# Patient Record
Sex: Male | Born: 1980 | Race: White | Hispanic: No | Marital: Single | Smoking: Current every day smoker
Health system: Southern US, Community
[De-identification: ages and names within clinical notes are randomized; demographics above are authoritative.]

## PROBLEM LIST (undated history)

## (undated) DIAGNOSIS — Z8709 Personal history of other diseases of the respiratory system: Secondary | ICD-10-CM

## (undated) DIAGNOSIS — F411 Generalized anxiety disorder: Secondary | ICD-10-CM

## (undated) DIAGNOSIS — F329 Major depressive disorder, single episode, unspecified: Secondary | ICD-10-CM

## (undated) DIAGNOSIS — F17200 Nicotine dependence, unspecified, uncomplicated: Secondary | ICD-10-CM

## (undated) DIAGNOSIS — R569 Unspecified convulsions: Secondary | ICD-10-CM

## (undated) HISTORY — DX: Unspecified convulsions: R56.9

## (undated) HISTORY — PX: KNEE SURGERY: SHX244

## (undated) HISTORY — DX: Major depressive disorder, single episode, unspecified: F32.9

## (undated) HISTORY — DX: Personal history of other diseases of the respiratory system: Z87.09

## (undated) HISTORY — DX: Nicotine dependence, unspecified, uncomplicated: F17.200

## (undated) HISTORY — DX: Generalized anxiety disorder: F41.1

---

## 2004-07-07 ENCOUNTER — Ambulatory Visit: Payer: Self-pay | Admitting: Family Medicine

## 2006-09-06 ENCOUNTER — Ambulatory Visit: Payer: Self-pay | Admitting: Family Medicine

## 2006-12-29 DIAGNOSIS — Z8709 Personal history of other diseases of the respiratory system: Secondary | ICD-10-CM | POA: Insufficient documentation

## 2006-12-29 DIAGNOSIS — R569 Unspecified convulsions: Secondary | ICD-10-CM

## 2006-12-29 DIAGNOSIS — F329 Major depressive disorder, single episode, unspecified: Secondary | ICD-10-CM | POA: Insufficient documentation

## 2006-12-29 DIAGNOSIS — F3289 Other specified depressive episodes: Secondary | ICD-10-CM

## 2006-12-29 HISTORY — DX: Major depressive disorder, single episode, unspecified: F32.9

## 2006-12-29 HISTORY — DX: Unspecified convulsions: R56.9

## 2006-12-29 HISTORY — DX: Other specified depressive episodes: F32.89

## 2006-12-29 HISTORY — DX: Personal history of other diseases of the respiratory system: Z87.09

## 2007-03-23 ENCOUNTER — Telehealth: Payer: Self-pay | Admitting: Family Medicine

## 2008-03-25 ENCOUNTER — Ambulatory Visit: Payer: Self-pay | Admitting: Family Medicine

## 2008-03-25 DIAGNOSIS — F411 Generalized anxiety disorder: Secondary | ICD-10-CM

## 2008-03-25 HISTORY — DX: Generalized anxiety disorder: F41.1

## 2009-04-10 ENCOUNTER — Ambulatory Visit: Payer: Self-pay | Admitting: Family Medicine

## 2009-04-10 DIAGNOSIS — F17200 Nicotine dependence, unspecified, uncomplicated: Secondary | ICD-10-CM

## 2009-04-10 HISTORY — DX: Nicotine dependence, unspecified, uncomplicated: F17.200

## 2009-04-14 ENCOUNTER — Telehealth: Payer: Self-pay | Admitting: Family Medicine

## 2009-10-27 ENCOUNTER — Telehealth: Payer: Self-pay | Admitting: *Deleted

## 2009-12-29 ENCOUNTER — Telehealth: Payer: Self-pay | Admitting: Family Medicine

## 2010-03-04 ENCOUNTER — Telehealth: Payer: Self-pay | Admitting: Family Medicine

## 2010-04-15 ENCOUNTER — Ambulatory Visit: Payer: Self-pay | Admitting: Family Medicine

## 2010-04-15 ENCOUNTER — Telehealth: Payer: Self-pay | Admitting: Family Medicine

## 2010-06-09 NOTE — Progress Notes (Signed)
Summary: refill request  Phone Note Refill Request Message from:  Fax from Pharmacy on October 27, 2009 1:15 PM  Refills Requested: Medication #1:  KLONOPIN 0.5 MG  TABS Take 1 tablet by mouth three times a day Initial call taken by: Kern Reap CMA (AAMA),  October 27, 2009 1:15 PM    Prescriptions: KLONOPIN 0.5 MG  TABS (CLONAZEPAM) Take 1 tablet by mouth three times a day  #90 x 1   Entered by:   Kern Reap CMA (AAMA)   Authorized by:   Roderick Pee MD   Signed by:   Kern Reap CMA (AAMA) on 10/27/2009   Method used:   Telephoned to ...       [facility]  [address] [phone]* (retail)       [address]       [place], [place]  [address]       Ph: [phone]       Fax: [phone]   RxID:   [id number]

## 2010-06-09 NOTE — Progress Notes (Signed)
Summary: Pt had to rsc. Pt req refill of Clonazepam to [facility] [address] [facility]  Phone Note Refill Request Call back at [facility] Phone [phone]   Refills Requested: Medication #1:  KLONOPIN 0.5 MG  TABS Take 1 tablet by mouth three times a day   Dosage confirmed as above?Dosage Confirmed Pt had to cx his ov for today, because he had to work. Pt has rsc for tues 04/21/10, but is req a refill of his Clonazepam to be called in to [facility] on [address] in [place].    Method Requested: Telephone to [facility] on [address] in [facility]  Initial call taken by: Lucy Antigua,  April 15, 2010 9:09 AM    Prescriptions: KLONOPIN 0.5 MG  TABS (CLONAZEPAM) Take 1 tablet by mouth three times a day  #90 x 0   Entered by:   Kern Reap CMA (AAMA)   Authorized by:   Roderick Pee MD   Signed by:   Kern Reap CMA (AAMA) on 04/15/2010   Method used:   Telephoned to ...       [facility]  [address] [phone]* (retail)       [address]       [place], [place]  [address]       Ph: [phone]       Fax: [phone]   RxID:   [id number]

## 2010-06-09 NOTE — Progress Notes (Signed)
Summary: refill  Phone Note Refill Request Message from:  Fax from Pharmacy on March 04, 2010 1:59 PM  Refills Requested: Medication #1:  KLONOPIN 0.5 MG  TABS Take 1 tablet by mouth three times a day Initial call taken by: Kern Reap CMA Duncan Dull),  March 04, 2010 1:59 PM    Prescriptions: KLONOPIN 0.5 MG  TABS (CLONAZEPAM) Take 1 tablet by mouth three times a day  #90 x 0   Entered by:   Kern Reap CMA (AAMA)   Authorized by:   Roderick Pee MD   Signed by:   Kern Reap CMA (AAMA) on 03/04/2010   Method used:   Historical   RxID:   [id number]

## 2010-06-09 NOTE — Progress Notes (Signed)
Summary: klonopin refill  Phone Note Refill Request Message from:  Fax from Pharmacy on December 29, 2009 12:18 PM  Refills Requested: Medication #1:  KLONOPIN 0.5 MG  TABS Take 1 tablet by mouth three times a day Initial call taken by: Kern Reap CMA Duncan Dull),  December 29, 2009 12:18 PM    Prescriptions: KLONOPIN 0.5 MG  TABS (CLONAZEPAM) Take 1 tablet by mouth three times a day  #90 x 1   Entered by:   Kern Reap CMA (AAMA)   Authorized by:   Roderick Pee MD   Signed by:   Kern Reap CMA (AAMA) on 12/29/2009   Method used:   Historical   RxID:   [id number]

## 2010-06-11 NOTE — Assessment & Plan Note (Signed)
Summary: MED CHECK/CJR/PT RSC/CJR   Vital Signs:  Patient profile:   30 year old male Height:      66 inches Weight:      211 pounds BMI:     34.18 Temp:     97.8 degrees F oral BP sitting:   120 / 80  (left arm) Cuff size:   regular  Vitals Entered By: Kern Reap CMA Duncan Dull) (April 21, 2010 10:22 AM) CC: follow-up visit   CC:  follow-up visit.  History of Present Illness: Bryan Nguyen is a 30 year old male ex smoker x 1 year.  He comes in today for follow-up of his medications.  He is currently off the chantix completely.  He is on Zoloft 100 mg daily and Klonopin .5 t.i.d. and his mood is good.  He is functioning well and doing well.  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 many years ago.  His concern is about weight gain.  He is at  211 pounds.  He would like to discuss an alternative medication  Allergies: No Known Drug Allergies  Past History:  Past medical, surgical, family and social histories (including risk factors) reviewed for relevance to current acute and chronic problems.  Past Medical History: Reviewed history from 03/25/2008 and no changes required. Depression Pneumonia, hx of Seizure disorder Anxiety  Family History: Reviewed history and no changes required.  Social History: Reviewed history from 03/25/2008 and no changes required.  Alcohol use-no Drug use-no Current Smoker Single  Review of Systems      See HPI  Physical Exam  General:  Well-developed,well-nourished,in no acute distress; alert,appropriate and cooperative throughout examination Psych:  Cognition and judgment appear intact. Alert and cooperative with normal attention span and concentration. No apparent delusions, illusions, hallucinations   Impression & Recommendations:  Problem # 1:  TOBACCO ABUSE (ICD-305.1) Assessment Improved  His updated medication  list for this problem includes:    Chantix Continuing Month Pak 1 Mg Tabs (Varenicline tartrate) ..... Uad  Problem # 2:  ANXIETY (ICD-300.00) Assessment: Improved  The following medications were removed from the medication list:    Zoloft 100 Mg Tabs (Sertraline hcl) .Marland Kitchen... Take one tab by mouth once daily His updated medication list for this problem includes:    Klonopin 0.5 Mg Tabs (Clonazepam) .Marland Kitchen... Take 1 tablet by mouth three times a day    Effexor Xr 75 Mg Xr24h-cap (Venlafaxine hcl) .Marland Kitchen... Take 1 tablet by mouth every morning  Problem # 3:  DEPRESSION (ICD-311) Assessment: Improved  The following medications were removed from the medication list:    Zoloft 100 Mg Tabs (Sertraline hcl) .Marland Kitchen... Take one tab by mouth once daily His updated medication list for this problem includes:    Klonopin 0.5 Mg Tabs (Clonazepam) .Marland Kitchen... Take 1 tablet by mouth three times a day    Effexor Xr 75 Mg Xr24h-cap (Venlafaxine hcl) .Marland Kitchen... Take 1 tablet by mouth every morning  Complete Medication List: 1)  Klonopin 0.5 Mg Tabs (Clonazepam) .... Take 1 tablet by mouth three times a day 2)  Chantix Continuing Month Pak 1 Mg Tabs (Varenicline tartrate) .... Uad 3)  Effexor Xr 75 Mg Xr24h-cap (Venlafaxine hcl) .... Take 1 tablet by mouth every morning  Patient Instructions: 1)  stop the Zoloft and start Effexor 75 mg daily.  If after one month, you doing well continue this medication if you have any  questions or problems...........  Call 2)  Continue the Klonopin .53 times a day. 3)  One half of the chantix tablet daily if you feel like smoking again 4)  Please schedule a follow-up appointment in 1 year. 5)  Please schedule a follow-up appointment as needed. Prescriptions: CHANTIX CONTINUING MONTH PAK 1 MG TABS (VARENICLINE TARTRATE) UAD  #1 x 3   Entered and Authorized by:   Roderick Pee MD   Signed by:   Roderick Pee MD on 04/21/2010   Method used:   Print then Give to Patient   RxID:    [id number] KLONOPIN 0.5 MG  TABS (CLONAZEPAM) Take 1 tablet by mouth three times a day  #100 x 11   Entered and Authorized by:   Roderick Pee MD   Signed by:   Roderick Pee MD on 04/21/2010   Method used:   Print then Give to Patient   RxID:   [id number] EFFEXOR XR 75 MG XR24H-CAP (VENLAFAXINE HCL) Take 1 tablet by mouth every morning  #100 x 3   Entered and Authorized by:   Roderick Pee MD   Signed by:   Roderick Pee MD on 04/21/2010   Method used:   Print then Give to Patient   RxID:   [id number]    Orders Added: 1)  Est. Patient Level III [01027]

## 2010-11-10 ENCOUNTER — Other Ambulatory Visit: Payer: Self-pay | Admitting: *Deleted

## 2010-11-10 MED ORDER — CLONAZEPAM 0.5 MG PO TABS: 0.5000 mg | ORAL_TABLET | Freq: Three times a day (TID) | ORAL | Status: DC | PRN

## 2010-11-12 ENCOUNTER — Other Ambulatory Visit: Payer: Self-pay | Admitting: Family Medicine

## 2010-11-12 NOTE — Telephone Encounter (Signed)
Pt called req status of clonazePAM (KLONOPIN) 0.5 MG tablet. Pls be sure this is called in asap to [facility] in [place] [phone]. Pt is completely of of medication.

## 2010-11-12 NOTE — Telephone Encounter (Signed)
Called in.

## 2011-05-28 ENCOUNTER — Encounter: Payer: Self-pay | Admitting: Family Medicine

## 2011-05-31 ENCOUNTER — Ambulatory Visit (INDEPENDENT_AMBULATORY_CARE_PROVIDER_SITE_OTHER): Payer: Self-pay | Admitting: Family Medicine

## 2011-05-31 ENCOUNTER — Encounter: Payer: Self-pay | Admitting: Family Medicine

## 2011-05-31 DIAGNOSIS — F329 Major depressive disorder, single episode, unspecified: Secondary | ICD-10-CM

## 2011-05-31 DIAGNOSIS — Z23 Encounter for immunization: Secondary | ICD-10-CM

## 2011-05-31 DIAGNOSIS — F411 Generalized anxiety disorder: Secondary | ICD-10-CM

## 2011-05-31 MED ORDER — VENLAFAXINE HCL ER 75 MG PO CP24: 75.0000 mg | ORAL_CAPSULE | Freq: Every day | ORAL | Status: DC

## 2011-05-31 MED ORDER — CLONAZEPAM 0.5 MG PO TABS: 0.5000 mg | ORAL_TABLET | Freq: Three times a day (TID) | ORAL | Status: DC | PRN

## 2011-05-31 NOTE — Progress Notes (Signed)
  Subjective:    Patient ID: Bryan Nguyen, male    DOB: [birth year], 31 y.o.   MRN: [id number]  HPI Bryan Nguyen  is a 31 year old single male Who comes in today for yearly evaluation because of a history of underlying anxiety and depression.  He currently takes Klonopin .53 times daily and Effexor, long-acting 75 mg daily.  He states his mood is good.  He is functioning well and wants to keep his medication.  The same.  He is working full time at a [employer], product/process development as Engineer, site also going to school and lying trying to give his history degree.  His 30 hours short of graduation.  He has a steady girlfriend for the last 5 years.  He quit smoking in October 2012 Review of Systems General and psychiatric review of systems otherwise negative    Objective:   Physical Exam Well-developed well-nourished, male in no acute distress.  He is oriented x 3, is appropriate, not depressed, not anxious       Assessment & Plan:  Anxiety/depression, clinically stable on medications.  Continue medications follow-up in one year or sooner if any problems

## 2011-05-31 NOTE — Patient Instructions (Signed)
Continue the Klonopin 3 times daily, and the Effexor once daily.  Return in one year or sooner if any problems

## 2011-09-23 ENCOUNTER — Telehealth: Payer: Self-pay | Admitting: Family Medicine

## 2011-09-23 ENCOUNTER — Other Ambulatory Visit: Payer: Self-pay | Admitting: *Deleted

## 2011-09-23 DIAGNOSIS — F411 Generalized anxiety disorder: Secondary | ICD-10-CM

## 2011-09-23 MED ORDER — CLONAZEPAM 0.5 MG PO TABS: 0.5000 mg | ORAL_TABLET | Freq: Three times a day (TID) | ORAL | Status: DC | PRN

## 2011-09-23 NOTE — Telephone Encounter (Signed)
Fleet Contras please call this medication cannot be refilled

## 2011-09-23 NOTE — Telephone Encounter (Signed)
Patient brought in "washed" bottle of medication.  Okay for a early refill per Dr Tawanna Cooler.  Rx called in.

## 2011-09-23 NOTE — Telephone Encounter (Signed)
Left message on machine for patient

## 2011-09-23 NOTE — Telephone Encounter (Signed)
[facility] Triage Call Report Triage Record Num: [id number] Operator: Reino Bellis Patient Name: Bryan Nguyen Call Date & Time: 09/22/2011 6:43:12PM Patient Phone: [phone] PCP: Eugenio Hoes. Todd Patient Gender: Male PCP Fax : [phone] Patient DOB: December 07, 1980 Practice Name: [facility] Reason for Call: Caller: Amber/Significant Other; PCP: Roderick Pee.; CB#: [phone]; Call regarding Medication Issue; Medication(s): klonopin; Pt. calling to request refill for Klonopin. Pt. reports he washed a whole bottle of medication (Klonopin) and his wallet in the washing machine on 09/22/11. RX was last refilled on 09/20/11. Pt. instructed to call office in AM on 09/23/11 to follow up with MD regarding medication refill. Pt. verbalizes understanding. Protocol(s) Used: Office Note Recommended Outcome per Protocol: Information Noted and Sent to Office Reason for Outcome: Caller information to office Care Advice: ~ 05/

## 2011-09-23 NOTE — Telephone Encounter (Signed)
Patient called stating that he refilled his Klonipin 3 days ago and he accidentally washed it in his pants pocket. Please advise.

## 2011-09-23 NOTE — Telephone Encounter (Signed)
Left message on machine for patient for patient 

## 2011-09-23 NOTE — Telephone Encounter (Signed)
Pt called back to check on status of getting a refill of Klonipin. Pt says that he can bring in the clump of meds, to show that he really did wash them in washing machine. Pt req call back.

## 2011-12-01 ENCOUNTER — Other Ambulatory Visit: Payer: Self-pay | Admitting: *Deleted

## 2011-12-01 DIAGNOSIS — F411 Generalized anxiety disorder: Secondary | ICD-10-CM

## 2011-12-01 MED ORDER — CLONAZEPAM 0.5 MG PO TABS: 0.5000 mg | ORAL_TABLET | Freq: Three times a day (TID) | ORAL | Status: DC | PRN

## 2011-12-09 ENCOUNTER — Ambulatory Visit: Payer: Self-pay | Admitting: Family Medicine

## 2011-12-13 ENCOUNTER — Encounter: Payer: Self-pay | Admitting: Family Medicine

## 2011-12-13 ENCOUNTER — Ambulatory Visit (INDEPENDENT_AMBULATORY_CARE_PROVIDER_SITE_OTHER): Payer: Managed Care, Other (non HMO) | Admitting: Family Medicine

## 2011-12-13 VITALS — BP 110/70 | Temp 97.9°F | Wt 188.0 lb

## 2011-12-13 DIAGNOSIS — R071 Chest pain on breathing: Secondary | ICD-10-CM

## 2011-12-13 DIAGNOSIS — F411 Generalized anxiety disorder: Secondary | ICD-10-CM

## 2011-12-13 DIAGNOSIS — R0789 Other chest pain: Secondary | ICD-10-CM

## 2011-12-13 MED ORDER — CLONAZEPAM 0.5 MG PO TABS: 0.5000 mg | ORAL_TABLET | Freq: Three times a day (TID) | ORAL | Status: DC | PRN

## 2011-12-13 NOTE — Patient Instructions (Signed)
Motrin 400 mg 3 times daily as needed for the chest wall pain  Continue the Klonopin 3 times daily  If you would like a second opinion to see if the other medications that might be more helpful then I would recommend Dr. Royetta Asal

## 2011-12-13 NOTE — Progress Notes (Signed)
  Subjective:    Patient ID: Bryan Nguyen, male    DOB: 12/05/1980, 31 y.o.   MRN: [id number]  HPI Bryan Nguyen is a 31 year old single male who comes in today for evaluation of 2 problems  He takes Klonopin 0.53 times daily for anxiety. He was on Effexor but he had side effects from that. Previously took Zoloft but he had side effects of weight gain. He's been working hard the last couple months has dropped from 228 down to 188 with diet and exercise.  For past 3 weeks she's had some discomfort in his chest which he describes as sometimes sharp sometimes dull sometimes her heart for a few seconds sometimes lasts for hours. He points to the left upper anterior chest wall area as a source of his discomfort. No history of any cardiac or pulmonary symptoms and no history of trauma   Review of Systems    general and cardiac and pulmonary review of systems and psychiatric review of systems otherwise negative Objective:   Physical Exam Well-developed well-nourished male no acute distress HEENT negative neck was supple no adenopathy lungs are clear       Assessment & Plan:  Chest wall pain reassured Motrin 400 mg 3 times a day when necessary  Anxiety continue Klonopin and question consult with Dr. Andee Poles

## 2012-05-29 ENCOUNTER — Ambulatory Visit: Payer: Managed Care, Other (non HMO) | Admitting: Family Medicine

## 2012-06-19 ENCOUNTER — Telehealth: Payer: Self-pay | Admitting: Family Medicine

## 2012-06-19 ENCOUNTER — Other Ambulatory Visit: Payer: Self-pay | Admitting: Family Medicine

## 2012-06-19 DIAGNOSIS — F411 Generalized anxiety disorder: Secondary | ICD-10-CM

## 2012-06-19 MED ORDER — CLONAZEPAM 0.5 MG PO TABS: 0.5000 mg | ORAL_TABLET | Freq: Three times a day (TID) | ORAL | Status: DC | PRN

## 2012-06-19 NOTE — Telephone Encounter (Signed)
Left message on voicemail. Rx for Clonazepam was called into pharmacy.

## 2012-06-19 NOTE — Telephone Encounter (Signed)
Patient called stating that he need a refill of his clonazepam sent to [facility] in [place] on [address]. Please assist.

## 2012-07-24 ENCOUNTER — Telehealth: Payer: Self-pay | Admitting: Family Medicine

## 2012-07-24 DIAGNOSIS — F411 Generalized anxiety disorder: Secondary | ICD-10-CM

## 2012-07-24 MED ORDER — CLONAZEPAM 0.5 MG PO TABS: 0.5000 mg | ORAL_TABLET | Freq: Three times a day (TID) | ORAL | Status: DC | PRN

## 2012-07-24 NOTE — Telephone Encounter (Signed)
Patient called stating that he need a refill of his clonazepam .5mg  1po tid sent to [facility] in [place]. Please assist.

## 2012-07-24 NOTE — Telephone Encounter (Signed)
Rx called in to pharmacy. 

## 2012-09-21 ENCOUNTER — Other Ambulatory Visit: Payer: Managed Care, Other (non HMO)

## 2012-09-28 ENCOUNTER — Encounter: Payer: Managed Care, Other (non HMO) | Admitting: Family Medicine

## 2012-10-10 ENCOUNTER — Telehealth: Payer: Self-pay | Admitting: Family Medicine

## 2012-10-10 DIAGNOSIS — F411 Generalized anxiety disorder: Secondary | ICD-10-CM

## 2012-10-10 NOTE — Telephone Encounter (Signed)
Pt called to request a refill of his clonazePAM (KLONOPIN) 0.5 MG tablet. His CPX had to be rescheduled for august, so he will need enough to last him until then. Please assist.

## 2012-10-11 MED ORDER — CLONAZEPAM 0.5 MG PO TABS: 0.5000 mg | ORAL_TABLET | Freq: Three times a day (TID) | ORAL | Status: DC | PRN

## 2012-10-11 NOTE — Telephone Encounter (Signed)
Rx called in to pharmacy. 

## 2012-10-19 ENCOUNTER — Other Ambulatory Visit: Payer: Managed Care, Other (non HMO)

## 2012-10-25 ENCOUNTER — Encounter: Payer: Managed Care, Other (non HMO) | Admitting: Family Medicine

## 2012-11-21 ENCOUNTER — Telehealth: Payer: Self-pay | Admitting: Family Medicine

## 2012-11-21 DIAGNOSIS — F411 Generalized anxiety disorder: Secondary | ICD-10-CM

## 2012-11-21 MED ORDER — CLONAZEPAM 0.5 MG PO TABS: 0.5000 mg | ORAL_TABLET | Freq: Three times a day (TID) | ORAL | Status: DC | PRN

## 2012-11-21 NOTE — Telephone Encounter (Signed)
Pt has cpx sch for 12-18-12. Pt needs refill on  clonazapam call into [facility] [place]

## 2012-12-12 ENCOUNTER — Other Ambulatory Visit: Payer: Managed Care, Other (non HMO)

## 2012-12-18 ENCOUNTER — Encounter: Payer: Managed Care, Other (non HMO) | Admitting: Family Medicine

## 2012-12-18 DIAGNOSIS — Z0289 Encounter for other administrative examinations: Secondary | ICD-10-CM

## 2013-01-15 ENCOUNTER — Ambulatory Visit: Payer: Managed Care, Other (non HMO) | Admitting: Internal Medicine

## 2013-01-18 ENCOUNTER — Telehealth: Payer: Self-pay | Admitting: Family Medicine

## 2013-01-18 DIAGNOSIS — F411 Generalized anxiety disorder: Secondary | ICD-10-CM

## 2013-01-18 MED ORDER — CLONAZEPAM 0.5 MG PO TABS: 0.5000 mg | ORAL_TABLET | Freq: Three times a day (TID) | ORAL | Status: DC | PRN

## 2013-01-18 NOTE — Telephone Encounter (Signed)
Rx called in to pharmacy. 

## 2013-01-18 NOTE — Telephone Encounter (Signed)
Pt would like a refill of clonazePAM (KLONOPIN) 0.5 MG tablet Pt prefers to wait for Dr Tawanna Cooler. Appt sch 10/13. Can you refill until Dr Tawanna Cooler gets back?  Note new Pharm: [facility]/ [address] [place], [place] store no [id number] [phone]

## 2013-01-19 ENCOUNTER — Ambulatory Visit: Payer: Managed Care, Other (non HMO) | Admitting: Family Medicine

## 2013-02-19 ENCOUNTER — Ambulatory Visit (INDEPENDENT_AMBULATORY_CARE_PROVIDER_SITE_OTHER): Payer: Managed Care, Other (non HMO) | Admitting: Family Medicine

## 2013-02-19 ENCOUNTER — Encounter: Payer: Self-pay | Admitting: Family Medicine

## 2013-02-19 VITALS — BP 120/80 | Temp 97.7°F | Wt 235.0 lb

## 2013-02-19 DIAGNOSIS — F3289 Other specified depressive episodes: Secondary | ICD-10-CM

## 2013-02-19 DIAGNOSIS — F411 Generalized anxiety disorder: Secondary | ICD-10-CM

## 2013-02-19 DIAGNOSIS — F329 Major depressive disorder, single episode, unspecified: Secondary | ICD-10-CM

## 2013-02-19 MED ORDER — CITALOPRAM HYDROBROMIDE 10 MG PO TABS: 10.0000 mg | ORAL_TABLET | Freq: Every day | ORAL | Status: AC

## 2013-02-19 MED ORDER — LORAZEPAM 1 MG PO TABS: ORAL_TABLET | ORAL | Status: DC

## 2013-02-19 NOTE — Patient Instructions (Signed)
Begin the Celexa 10 mg at bedtime  Ativan one twice daily..........Marland Kitchen or half a tab twice daily  Discuss with your psychologist/counselor about getting a consult with one psychiatrist in your community for further medical evaluation

## 2013-02-19 NOTE — Progress Notes (Signed)
  Subjective:    Patient ID: Bryan Nguyen, male    DOB: 09/18/1980, 32 y.o.   MRN: [id number]  HPI Bryan Nguyen is a 32 year old male single nonsmoker who comes in today to discuss anxiety and depression  He's had a long-standing history of anxiety and depression has affected him since he was about 19 or 89 when his mother died. 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 He says his stress is disabling at times. He's been taking Klonopin 0.5 3 times a day when necessary but he doesn't seem to help. He tried Effexor but it made him too anxious. He's also tried Paxil and Zoloft and Lexapro in the past and those medicines didn't seem to help either.  He's seeing a counselor through the [employer] program at work he's had one session    Review of Systems    review of systems negative Objective:   Physical Exam  Well-developed well-nourished male no acute distress vital signs stable he is afebrile he is oriented x3 is a alert understands his situation and seems willing to find some solutions      Assessment & Plan:  Depression/anxiety plan start low-dose Celexa Ativan when necessary psych consult ASAP

## 2013-04-13 ENCOUNTER — Telehealth: Payer: Self-pay | Admitting: Family Medicine

## 2013-04-13 DIAGNOSIS — F411 Generalized anxiety disorder: Secondary | ICD-10-CM

## 2013-04-13 DIAGNOSIS — F329 Major depressive disorder, single episode, unspecified: Secondary | ICD-10-CM

## 2013-04-13 NOTE — Telephone Encounter (Signed)
Pt requesting refill of  LORazepam (ATIVAN) 1 MG tablet sent to  [facility] [address].  Pt states Dr. Tawanna Cooler referred him to a psychiatrist but he can not be seen until 04/30/13 and needs enough medication until his appointment.

## 2013-04-17 MED ORDER — LORAZEPAM 1 MG PO TABS: ORAL_TABLET | ORAL | Status: AC

## 2013-04-17 NOTE — Telephone Encounter (Signed)
Rx ready for pick up and Left message on machine for patient   

## 2013-04-23 ENCOUNTER — Telehealth: Payer: Self-pay | Admitting: Family Medicine

## 2013-04-23 ENCOUNTER — Other Ambulatory Visit: Payer: Self-pay | Admitting: Family Medicine

## 2013-04-23 NOTE — Telephone Encounter (Signed)
Pt would like to know if we can send rx LORazepam (ATIVAN) 1 MG tablet To the pharm for him? Pt lives in [place] and it [place] long way [place] here. [facility] [address] [place]

## 2013-04-23 NOTE — Telephone Encounter (Signed)
done

## 2013-05-01 ENCOUNTER — Other Ambulatory Visit: Payer: Self-pay | Admitting: *Deleted

## 2013-12-17 ENCOUNTER — Ambulatory Visit: Payer: Managed Care, Other (non HMO) | Admitting: Family Medicine

## 2014-03-12 ENCOUNTER — Ambulatory Visit: Payer: Managed Care, Other (non HMO) | Admitting: Family Medicine

## 2014-03-18 ENCOUNTER — Ambulatory Visit: Payer: Managed Care, Other (non HMO) | Admitting: Family Medicine

## 2016-10-10 ENCOUNTER — Emergency Department (HOSPITAL_COMMUNITY): Payer: Managed Care, Other (non HMO)

## 2016-10-10 ENCOUNTER — Emergency Department (HOSPITAL_COMMUNITY)
Admission: EM | Admit: 2016-10-10 | Discharge: 2016-10-10 | Disposition: A | Discharge status: Home / Self Care | Payer: Managed Care, Other (non HMO) | Attending: Emergency Medicine | Admitting: Emergency Medicine

## 2016-10-10 ENCOUNTER — Encounter (HOSPITAL_COMMUNITY): Payer: Self-pay

## 2016-10-10 DIAGNOSIS — Z720 Tobacco use: Secondary | ICD-10-CM

## 2016-10-10 DIAGNOSIS — F17210 Nicotine dependence, cigarettes, uncomplicated: Secondary | ICD-10-CM | POA: Insufficient documentation

## 2016-10-10 DIAGNOSIS — R0789 Other chest pain: Secondary | ICD-10-CM | POA: Insufficient documentation

## 2016-10-10 DIAGNOSIS — R079 Chest pain, unspecified: Secondary | ICD-10-CM

## 2016-10-10 LAB — BASIC METABOLIC PANEL
Anion gap: 10 (ref 5–15)
BUN: 17 mg/dL (ref 6–20)
CALCIUM: 9.6 mg/dL (ref 8.9–10.3)
CO2: 24 mmol/L (ref 22–32)
CREATININE: 0.86 mg/dL (ref 0.61–1.24)
Chloride: 103 mmol/L (ref 101–111)
GFR calc Af Amer: >60 mL/min (ref >60)
GFR calc non Af Amer: >60 mL/min (ref >60)
GLUCOSE: 115 mg/dL — AB (ref 65–99)
Potassium: 4.1 mmol/L (ref 3.5–5.1)
Sodium: 137 mmol/L (ref 135–145)

## 2016-10-10 LAB — CBC
HCT: 45.3 % (ref 39.0–52.0)
Hemoglobin: 16.4 g/dL (ref 13.0–17.0)
MCH: 29.7 pg (ref 26.0–34.0)
MCHC: 36.2 g/dL — AB (ref 30.0–36.0)
MCV: 81.9 fL (ref 78.0–100.0)
PLATELETS: 287 10*3/uL (ref 150–400)
RBC: 5.53 MIL/uL (ref 4.22–5.81)
RDW: 14.5 % (ref 11.5–15.5)
WBC: 14.5 10*3/uL — ABNORMAL HIGH (ref 4.0–10.5)

## 2016-10-10 LAB — I-STAT TROPONIN, ED: TROPONIN I, POC: 0 ng/mL (ref 0.00–0.08)

## 2016-10-10 NOTE — ED Triage Notes (Addendum)
Per Pt, Pt is coming from home with complaints of mid-center chest pain radiating down his left arm. Associated SOB. Reports recently quitting smoking. Reports last cigarettes on Friday. Pt also reports drinking and excessive amount of alcohol on Friday. Denies N/V/D .

## 2016-10-10 NOTE — Discharge Instructions (Signed)
Please read attached information. If you experience any new or worsening signs or symptoms please return to the emergency room for evaluation. Please follow-up with your primary care provider or specialist as discussed. Please use medication prescribed only as directed and discontinue taking if you have any concerning signs or symptoms.   °

## 2016-10-10 NOTE — ED Notes (Signed)
Patient left at this time with all belongings. 

## 2016-10-10 NOTE — ED Provider Notes (Signed)
[facility]-EMERGENCY DEPT Provider Note   CSN: [id number] Arrival date & time: 10/10/16  1223     History   Chief Complaint Chief Complaint  Patient presents with  . Chest Pain    HPI Bryan Nguyen is a 36 y.o. male.  HPI   89 male presents today with complaints of chest discomfort.  Patient reports that 2 days ago he was binge drinking wine. He notes he was excessively smoking cigarettes. He woke up yesterday with heart palpitations heaviness on his chest and sharp shooting pain from his chest to his arm. He notes this is not abnormal for him as he frequently binges on alcohol waking up with palpitations and chest pressure. Patient notes he also has history of anxiety that causes similar symptoms. He notes the sharp shooting pain was new for him. He reports waking up today with heaviness still, and intermittent sharp shooting pain. He denies any aggravating or relieving factors. He denies any significant shortness of breath, cough, fever, abdominal pain, or indigestion. He denies any significant previous cardiac history, denies any close family history of CAD or stroke. He does note family members dying at a late age due to congestive heart failure. Patient denies any drug use. He denies any lower extremity swelling or edema, or any significant risk factors for DVT or PE, no history of the same. Patient has been seen in the emergency room adverse locations with cardiac workup for similar symptoms with no significant findings. Patient denies any significant past medical history including hypertension, high cholesterol, diabetes. Patient notes he is a daily smoker for the last 20 years. He reports trying to quit in the past noted in go several days. Patient has not seen primary care in some time, but used to see Dr. Tawanna Coolerodd.     Past Medical History:  Diagnosis Date  . ANXIETY 03/25/2008  . DEPRESSION 12/29/2006  . PNEUMONIA, HX OF 12/29/2006  . SEIZURE DISORDER 12/29/2006  . TOBACCO ABUSE  04/10/2009    Patient Active Problem List   Diagnosis Date Noted  . Chest wall pain 12/13/2011  . ANXIETY 03/25/2008  . DEPRESSION 12/29/2006    Past Surgical History:  Procedure Laterality Date  . KNEE SURGERY       Home Medications    Prior to Admission medications   Medication Sig Start Date End Date Taking? Authorizing Provider  citalopram (CELEXA) 10 MG tablet Take 1 tablet (10 mg total) by mouth daily. 02/19/13   Roderick Peeodd, Squire Withey A, MD  clonazePAM Scarlette Calico(KLONOPIN) 0.5 MG tablet  01/24/13   [provider]  LORazepam (ATIVAN) 1 MG tablet 1 by mouth twice a day 04/17/13   Roderick Peeodd, Hadar Elgersma A, MD    Family History No family history on file.  Social History Social History  Substance Use Topics  . Smoking status: Current Every Day Smoker    Packs/day: 0.50    Types: Cigarettes    Last attempt to quit: 02/08/2011  . Smokeless tobacco: Never Used  . Alcohol use Yes     Allergies   Patient has no known allergies.   Review of Systems Review of Systems  All other systems reviewed and are negative.  Physical Exam Updated Vital Signs BP 130/84 (BP Location: Right Arm)   Pulse 67   Temp 98 F (36.7 C) (Oral)   Resp 16   Ht 5\' 10"  (1.778 m)   Wt 104.3 kg (230 lb)   SpO2 98%   BMI 33.00 kg/m   Physical Exam  Constitutional: He is oriented to person, place, and time. He appears well-developed and well-nourished.  HENT:  Head: Normocephalic and atraumatic.  Eyes: Conjunctivae are normal. Pupils are equal, round, and reactive to light. Right eye exhibits no discharge. Left eye exhibits no discharge. No scleral icterus.  Neck: Normal range of motion. No JVD present. No tracheal deviation present.  Cardiovascular: Normal rate, regular rhythm, normal heart sounds and intact distal pulses.  Exam reveals no gallop and no friction rub.   No murmur heard. Pulmonary/Chest: Effort normal and breath sounds normal. No stridor. No respiratory distress. He has no wheezes. He has  no rales. He exhibits no tenderness.  Abdominal: Soft. He exhibits no distension and no mass. There is no tenderness. There is no rebound and no guarding. No hernia.  Musculoskeletal: Normal range of motion.  Neurological: He is alert and oriented to person, place, and time. Coordination normal.  Skin: Skin is warm.  Psychiatric: He has a normal mood and affect. His behavior is normal. Judgment and thought content normal.  Nursing note and vitals reviewed.   ED Treatments / Results  Labs (all labs ordered are listed, but only abnormal results are displayed) Labs Reviewed  BASIC METABOLIC PANEL - Abnormal; Notable for the following:       Result Value   Glucose, Bld 115 (*)    All other components within normal limits  CBC - Abnormal; Notable for the following:    WBC 14.5 (*)    MCHC 36.2 (*)    All other components within normal limits  I-STAT TROPOININ, ED    EKG  EKG Interpretation None       Radiology Dg Chest 2 View  Result Date: 10/10/2016 CLINICAL DATA:  Chest pain radiating to left arm. Shortness of breath. EXAM: CHEST  2 VIEW COMPARISON:  None. FINDINGS: Upper limits normal heart size noted. There is no evidence of focal airspace disease, pulmonary edema, suspicious pulmonary nodule/mass, pleural effusion, or pneumothorax. No acute bony abnormalities are identified. IMPRESSION: Upper limits normal heart size without evidence of active cardiopulmonary disease. Electronically Signed   By: Harmon Pier M.D.   On: 10/10/2016 13:27    Procedures Procedures (including critical care time)  Discussed smoking cessation with patient and was they were offerred resources to help stop.  Total time was 5 min CPT code 96045.   Medications Ordered in ED Medications - No data to display   Initial Impression / Assessment and Plan / ED Course  I have reviewed the triage vital signs and the nursing notes.  Pertinent labs & imaging results that were available during my care of the  patient were reviewed by me and considered in my medical decision making (see chart for details).       Final Clinical Impressions(s) / ED Diagnoses   Final diagnoses:  Nonspecific chest pain  Tobacco abuse    Labs: I-STAT troponin, BNP, CBC  Imaging: DG chest 2 view  Consults:  Therapeutics:  Discharge Meds:   Assessment/Plan: 36 year old male with no significant past cardiac history presents today with complaints of chest pain. Patient was binge drinking and woke up yesterday with chest pressure, anxiety, and sharp intermittent chest pain. Patient has a very low heart score with his only risk factor being smoking. His troponin here is normal, his EKG is very reassuring, and the remainder of his laboratory analysis reassuring. He has very slight elevation in his WBCs of 14.5, no infectious etiology today.  I have very low suspicion  for ACS in this otherwise healthy male. Symptoms have been present for the last several days and are reportedly improving. Patient's presentation is not consistent with PE, he is perk negative, have very low suspicion for this. Patient's symptoms are likely related to recent episode of binge drinking and anxiety.  Patient will not require any further evaluation or management here in the ED setting. I counseled him on smoking cessation, alcohol abstinence, and encouraged him to follow up with his primary care provider for ongoing management. I will refer him to cardiology to establish care and further evaluation. Patient is given strict return precautions, he verbalized understanding and agreement to today's plan had no further questions or concerns at the time of discharge.   New Prescriptions New Prescriptions   No medications on file     Rosalio Loud 10/10/16 1451    Bethann Berkshire, MD 10/10/16 [phone]

## 2017-02-15 ENCOUNTER — Emergency Department (HOSPITAL_COMMUNITY)
Admission: EM | Admit: 2017-02-15 | Discharge: 2017-02-15 | Disposition: A | Discharge status: Home / Self Care | Payer: Managed Care, Other (non HMO) | Attending: Emergency Medicine | Admitting: Emergency Medicine

## 2017-02-15 ENCOUNTER — Encounter (HOSPITAL_COMMUNITY): Payer: Self-pay | Admitting: Emergency Medicine

## 2017-02-15 DIAGNOSIS — F419 Anxiety disorder, unspecified: Secondary | ICD-10-CM | POA: Diagnosis present

## 2017-02-15 DIAGNOSIS — Z5321 Procedure and treatment not carried out due to patient leaving prior to being seen by health care provider: Secondary | ICD-10-CM | POA: Diagnosis not present

## 2017-02-15 DIAGNOSIS — G4700 Insomnia, unspecified: Secondary | ICD-10-CM | POA: Diagnosis not present

## 2017-02-15 LAB — COMPREHENSIVE METABOLIC PANEL
ALBUMIN: 4.2 g/dL (ref 3.5–5.0)
ALT: 34 U/L (ref 17–63)
AST: 25 U/L (ref 15–41)
Alkaline Phosphatase: 70 U/L (ref 38–126)
Anion gap: 10 (ref 5–15)
BUN: 7 mg/dL (ref 6–20)
CHLORIDE: 102 mmol/L (ref 101–111)
CO2: 24 mmol/L (ref 22–32)
Calcium: 10 mg/dL (ref 8.9–10.3)
Creatinine, Ser: 0.88 mg/dL (ref 0.61–1.24)
GFR calc Af Amer: >60 mL/min (ref >60)
GFR calc non Af Amer: >60 mL/min (ref >60)
GLUCOSE: 123 mg/dL — AB (ref 65–99)
POTASSIUM: 3.7 mmol/L (ref 3.5–5.1)
SODIUM: 136 mmol/L (ref 135–145)
TOTAL PROTEIN: 7.4 g/dL (ref 6.5–8.1)
Total Bilirubin: 0.6 mg/dL (ref 0.3–1.2)

## 2017-02-15 LAB — CBC WITH DIFFERENTIAL/PLATELET
BASOS ABS: 0 10*3/uL (ref 0.0–0.1)
Basophils Relative: 0 %
EOS ABS: 0.1 10*3/uL (ref 0.0–0.7)
EOS PCT: 1 %
HCT: 42.1 % (ref 39.0–52.0)
Hemoglobin: 15.2 g/dL (ref 13.0–17.0)
LYMPHS PCT: 16 %
Lymphs Abs: 2.4 10*3/uL (ref 0.7–4.0)
MCH: 30.5 pg (ref 26.0–34.0)
MCHC: 36.1 g/dL — ABNORMAL HIGH (ref 30.0–36.0)
MCV: 84.5 fL (ref 78.0–100.0)
MONO ABS: 1.1 10*3/uL — AB (ref 0.1–1.0)
Monocytes Relative: 7 %
Neutro Abs: 11.3 10*3/uL — ABNORMAL HIGH (ref 1.7–7.7)
Neutrophils Relative %: 76 %
Platelets: 252 10*3/uL (ref 150–400)
RBC: 4.98 MIL/uL (ref 4.22–5.81)
RDW: 14.1 % (ref 11.5–15.5)
WBC: 14.9 10*3/uL — ABNORMAL HIGH (ref 4.0–10.5)

## 2017-02-15 LAB — RAPID URINE DRUG SCREEN, HOSP PERFORMED
AMPHETAMINES: NOT DETECTED
BARBITURATES: NOT DETECTED
BENZODIAZEPINES: NOT DETECTED
COCAINE: NOT DETECTED
Opiates: NOT DETECTED
TETRAHYDROCANNABINOL: NOT DETECTED

## 2017-02-15 LAB — I-STAT CHEM 8, ED
BUN: 9 mg/dL (ref 6–20)
CHLORIDE: 105 mmol/L (ref 101–111)
CREATININE: 0.8 mg/dL (ref 0.61–1.24)
Calcium, Ion: 1.27 mmol/L (ref 1.15–1.40)
GLUCOSE: 122 mg/dL — AB (ref 65–99)
HCT: 46 % (ref 39.0–52.0)
Hemoglobin: 15.6 g/dL (ref 13.0–17.0)
Potassium: 3.7 mmol/L (ref 3.5–5.1)
Sodium: 141 mmol/L (ref 135–145)
TCO2: 25 mmol/L (ref 22–32)

## 2017-02-15 LAB — ETHANOL: Alcohol, Ethyl (B): <10 mg/dL (ref <10)

## 2017-02-15 LAB — ACETAMINOPHEN LEVEL: Acetaminophen (Tylenol), Serum: <10 ug/mL — ABNORMAL LOW (ref 10–30)

## 2017-02-15 LAB — SALICYLATE LEVEL: Salicylate Lvl: <7 mg/dL (ref 2.8–30.0)

## 2017-02-15 NOTE — ED Triage Notes (Addendum)
Pt st's he is a heavy drinker and now wants to stop  St's last drink 4 days ago.  Pt st's he was told to come to ED to medically cleared so he could get into a rehab program.  Pt denies any pain st's he just feels like his anxiety is up. St's he has been unable to sleep

## 2017-09-13 ENCOUNTER — Other Ambulatory Visit: Payer: Self-pay

## 2017-09-13 ENCOUNTER — Emergency Department (HOSPITAL_COMMUNITY): Payer: Self-pay

## 2017-09-13 ENCOUNTER — Encounter (HOSPITAL_COMMUNITY): Payer: Self-pay

## 2017-09-13 ENCOUNTER — Emergency Department (HOSPITAL_COMMUNITY)
Admission: EM | Admit: 2017-09-13 | Discharge: 2017-09-13 | Disposition: A | Discharge status: Home / Self Care | Payer: Self-pay | Attending: Emergency Medicine | Admitting: Emergency Medicine

## 2017-09-13 DIAGNOSIS — F17210 Nicotine dependence, cigarettes, uncomplicated: Secondary | ICD-10-CM | POA: Insufficient documentation

## 2017-09-13 DIAGNOSIS — Z79899 Other long term (current) drug therapy: Secondary | ICD-10-CM | POA: Insufficient documentation

## 2017-09-13 DIAGNOSIS — R1011 Right upper quadrant pain: Secondary | ICD-10-CM | POA: Insufficient documentation

## 2017-09-13 DIAGNOSIS — R1031 Right lower quadrant pain: Secondary | ICD-10-CM

## 2017-09-13 LAB — COMPREHENSIVE METABOLIC PANEL
ALBUMIN: 4.1 g/dL (ref 3.5–5.0)
ALT: 39 U/L (ref 17–63)
ANION GAP: 9 (ref 5–15)
AST: 27 U/L (ref 15–41)
Alkaline Phosphatase: 67 U/L (ref 38–126)
BILIRUBIN TOTAL: 0.7 mg/dL (ref 0.3–1.2)
BUN: 8 mg/dL (ref 6–20)
CHLORIDE: 105 mmol/L (ref 101–111)
CO2: 24 mmol/L (ref 22–32)
Calcium: 9.6 mg/dL (ref 8.9–10.3)
Creatinine, Ser: 0.76 mg/dL (ref 0.61–1.24)
GFR calc Af Amer: >60 mL/min (ref >60)
GFR calc non Af Amer: >60 mL/min (ref >60)
GLUCOSE: 106 mg/dL — AB (ref 65–99)
POTASSIUM: 4.3 mmol/L (ref 3.5–5.1)
SODIUM: 138 mmol/L (ref 135–145)
Total Protein: 7.2 g/dL (ref 6.5–8.1)

## 2017-09-13 LAB — CBC WITH DIFFERENTIAL/PLATELET
BASOS ABS: 0 10*3/uL (ref 0.0–0.1)
Basophils Relative: 0 %
EOS ABS: 0.3 10*3/uL (ref 0.0–0.7)
EOS PCT: 2 %
HCT: 43 % (ref 39.0–52.0)
Hemoglobin: 15.7 g/dL (ref 13.0–17.0)
Lymphocytes Relative: 13 %
Lymphs Abs: 1.8 10*3/uL (ref 0.7–4.0)
MCH: 30.2 pg (ref 26.0–34.0)
MCHC: 36.5 g/dL — AB (ref 30.0–36.0)
MCV: 82.7 fL (ref 78.0–100.0)
MONO ABS: 1.2 10*3/uL — AB (ref 0.1–1.0)
MONOS PCT: 8 %
Neutro Abs: 10.4 10*3/uL — ABNORMAL HIGH (ref 1.7–7.7)
Neutrophils Relative %: 77 %
PLATELETS: 272 10*3/uL (ref 150–400)
RBC: 5.2 MIL/uL (ref 4.22–5.81)
RDW: 13.3 % (ref 11.5–15.5)
WBC: 13.7 10*3/uL — ABNORMAL HIGH (ref 4.0–10.5)

## 2017-09-13 LAB — URINALYSIS, ROUTINE W REFLEX MICROSCOPIC
Bilirubin Urine: NEGATIVE
Glucose, UA: NEGATIVE mg/dL
Hgb urine dipstick: NEGATIVE
KETONES UR: NEGATIVE mg/dL
LEUKOCYTES UA: NEGATIVE
NITRITE: NEGATIVE
PH: 6 (ref 5.0–8.0)
Protein, ur: NEGATIVE mg/dL
Specific Gravity, Urine: 1.006 (ref 1.005–1.030)

## 2017-09-13 LAB — LIPASE, BLOOD: Lipase: 33 U/L (ref 11–51)

## 2017-09-13 MED ORDER — OMEPRAZOLE 20 MG PO CPDR: 20.0000 mg | DELAYED_RELEASE_CAPSULE | Freq: Every day | ORAL | 1 refills | Status: AC

## 2017-09-13 NOTE — ED Triage Notes (Signed)
Pt reports right side flank pain described as dull pain. Pt reports some nausea, denies vomiting. Pt denies fevers, urinary symptoms.

## 2017-09-13 NOTE — Discharge Instructions (Signed)
Ranitidine twice daily  Omeprazole daily for 30 days See GI if you have ongoing pain Reduce the amount of alcohol you drink

## 2017-09-13 NOTE — ED Provider Notes (Signed)
Patient placed in Quick Look pathway, seen and evaluated   Chief Complaint: R upper abd, flank pain  HPI:   Patient with no previous surgical history presents with right upper quadrant and right flank pain, starting on Sunday.  Pain is 5 out of 10 at maximum.  No associated fevers, nausea, vomiting, diarrhea.  Patient states that he might have seen blood in his stool over the past 2 days, but it also might have been something he ate.  No urinary symptoms.  No history of kidney stones.  No treatments prior to arrival.  Patient drinks 3 days a week.  He takes ibuprofen almost every day.  Pain is not made worse with eating.  ROS:  Positive ROS: (+) Abdominal pain Negative ROS: (-) Nausea, vomiting, diarrhea, constipation, blood in the urine  Physical Exam:   Gen: No distress  Neuro: Awake and Alert  Skin: Warm    Focused Exam: Heart RRR, nml S1,S2, no m/r/g; Lungs CTAB; Abd soft, minimal right upper quadrant and right lateral upper abdominal pain, no rebound or guarding; Ext 2+ pedal pulses bilaterally, no edema.  BP (!) 133/91 (BP Location: Right Arm)   Pulse (!) 103   Temp 98.2 F (36.8 C) (Oral)   Resp 16   SpO2 95%   Plan: Ultrasound ordered, abdominal pain labs ordered.  Gastritis, duodenitis, symptomatic cholelithiasis, less likely renal colic on differential.  Low concern for cardiac etiology or pulmonary etiology at this time.  Initiation of care has begun. The patient has been counseled on the process, plan, and necessity for staying for the completion/evaluation, and the remainder of the medical screening examination    Renne Crigler, PA-C 09/13/17 1409    Charlynne Pander, MD 09/13/17 [phone]

## 2017-09-13 NOTE — ED Provider Notes (Signed)
[facility] EMERGENCY DEPARTMENT Provider Note   CSN: [id number] Arrival date & time: 09/13/17  1309     History   Chief Complaint Chief Complaint  Patient presents with  . Flank Pain    HPI Bryan Nguyen is a 37 y.o. male.  HPI  The patient is a 37 year old male, he is known to have significant history of alcohol use, he presents with some right upper quadrant pain which is been intermittent for several days, he has had similar symptoms in the past which were more transient.  He states it is worse at night, it does not have any relationship to eating and there is been no abnormal stools though he has had increased gas and belching.  Symptoms are intermittent, they are moderate at this time.  Not associated with any other symptoms.  He denies any history of abdominal surgery.  Past Medical History:  Diagnosis Date  . ANXIETY 03/25/2008  . DEPRESSION 12/29/2006  . PNEUMONIA, HX OF 12/29/2006  . SEIZURE DISORDER 12/29/2006  . TOBACCO ABUSE 04/10/2009    Patient Active Problem List   Diagnosis Date Noted  . Chest wall pain 12/13/2011  . ANXIETY 03/25/2008  . DEPRESSION 12/29/2006    Past Surgical History:  Procedure Laterality Date  . KNEE SURGERY          Home Medications    Prior to Admission medications   Medication Sig Start Date End Date Taking? Authorizing Provider  citalopram (CELEXA) 10 MG tablet Take 1 tablet (10 mg total) by mouth daily. 02/19/13   Roderick Pee, MD  clonazePAM Scarlette Calico) 0.5 MG tablet  01/24/13   [provider]  LORazepam (ATIVAN) 1 MG tablet 1 by mouth twice a day 04/17/13   Roderick Pee, MD  omeprazole (PRILOSEC) 20 MG capsule Take 1 capsule (20 mg total) by mouth daily. 09/13/17   Eber Hong, MD    Family History History reviewed. No pertinent family history.  Social History Social History   Tobacco Use  . Smoking status: Current Every Day Smoker    Packs/day: 0.50    Types: Cigarettes    Last  attempt to quit: 02/08/2011    Years since quitting: 6.6  . Smokeless tobacco: Never Used  Substance Use Topics  . Alcohol use: Yes  . Drug use: No     Allergies   Patient has no known allergies.   Review of Systems Review of Systems  All other systems reviewed and are negative.    Physical Exam Updated Vital Signs BP 122/77   Pulse 93   Temp 98.2 F (36.8 C) (Oral)   Resp 14   SpO2 96%   Physical Exam  Constitutional: He appears well-developed and well-nourished. No distress.  HENT:  Head: Normocephalic and atraumatic.  Mouth/Throat: Oropharynx is clear and moist. No oropharyngeal exudate.  Eyes: Pupils are equal, round, and reactive to light. Conjunctivae and EOM are normal. Right eye exhibits no discharge. Left eye exhibits no discharge. No scleral icterus.  Neck: Normal range of motion. Neck supple. No JVD present. No thyromegaly present.  Cardiovascular: Normal rate, regular rhythm, normal heart sounds and intact distal pulses. Exam reveals no gallop and no friction rub.  No murmur heard. Pulmonary/Chest: Effort normal and breath sounds normal. No respiratory distress. He has no wheezes. He has no rales.  Abdominal: Soft. Bowel sounds are normal. He exhibits no distension and no mass. There is no tenderness.  Extremely soft nontender abdomen, no pain anywhere  including right upper or right lower quadrants.  No Murphy sign  Musculoskeletal: Normal range of motion. He exhibits no edema or tenderness.  Lymphadenopathy:    He has no cervical adenopathy.  Neurological: He is alert. Coordination normal.  Skin: Skin is warm and dry. No rash noted. No erythema.  Psychiatric: He has a normal mood and affect. His behavior is normal.  Nursing note and vitals reviewed.    ED Treatments / Results  Labs (all labs ordered are listed, but only abnormal results are displayed) Labs Reviewed  CBC WITH DIFFERENTIAL/PLATELET - Abnormal; Notable for the following components:       Result Value   WBC 13.7 (*)    MCHC 36.5 (*)    Neutro Abs 10.4 (*)    Monocytes Absolute 1.2 (*)    All other components within normal limits  COMPREHENSIVE METABOLIC PANEL - Abnormal; Notable for the following components:   Glucose, Bld 106 (*)    All other components within normal limits  LIPASE, BLOOD  URINALYSIS, ROUTINE W REFLEX MICROSCOPIC    EKG None  Radiology US Abdomen Limited Ruq  Result Date: 09/13/2017 CLINICAL DATA:  RIGHT upper quadrant pain for 3 days. EXAM: ULTRASOUND ABDOMEN LIMITED RIGHT UPPER QUADRANT COMPARISON:  None. FINDINGS: Gallbladder: No gallstones or wall thickening visualized. No sonographic Murphy sign noted by sonographer. Common bile duct: Diameter: Normal measuring 3.0 mm. Liver: No focal lesion identified. No dilated ducts. Increased echogenicity portal vein is patent on color Doppler imaging with normal direction of blood flow towards the liver. IMPRESSION: No gallstones or biliary ductal dilatation. Increased hepatic echogenicity, consistent with steatosis. Electronically Signed   By: Elsie Stain M.D.   On: 09/13/2017 15:54    Procedures Procedures (including critical care time)  Medications Ordered in ED Medications - No data to display   Initial Impression / Assessment and Plan / ED Course  I have reviewed the triage vital signs and the nursing notes.  Pertinent labs & imaging results that were available during my care of the patient were reviewed by me and considered in my medical decision making (see chart for details).     Ultrasound reviewed, patient informed that he has steatosis of the liver, no gallstones, blood counts are elevated but he states they are chronically elevated, no other abnormal liver or renal function findings.  Patient stable for discharge, recommended increased amount of ranitidine and omeprazole.  Patient agreeable.  Final Clinical Impressions(s) / ED Diagnoses   Final diagnoses:  RLQ abdominal pain  Right  upper quadrant abdominal pain of unknown etiology    ED Discharge Orders        Ordered    omeprazole (PRILOSEC) 20 MG capsule  Daily     09/13/17 2048       Eber Hong, MD 09/13/17 2051

## 2017-11-22 ENCOUNTER — Ambulatory Visit: Payer: Medicaid Other | Admitting: Family Medicine

## 2017-12-01 ENCOUNTER — Ambulatory Visit: Payer: Medicaid Other | Admitting: Family Medicine

## 2017-12-14 ENCOUNTER — Encounter (HOSPITAL_COMMUNITY): Payer: Self-pay | Admitting: *Deleted

## 2017-12-14 ENCOUNTER — Emergency Department (HOSPITAL_COMMUNITY)
Admission: EM | Admit: 2017-12-14 | Discharge: 2017-12-14 | Discharge status: Against Medical Advice | Payer: Medicaid Other | Attending: Emergency Medicine | Admitting: Emergency Medicine

## 2017-12-14 ENCOUNTER — Other Ambulatory Visit: Payer: Self-pay

## 2017-12-14 DIAGNOSIS — F419 Anxiety disorder, unspecified: Secondary | ICD-10-CM | POA: Insufficient documentation

## 2017-12-14 DIAGNOSIS — Z79899 Other long term (current) drug therapy: Secondary | ICD-10-CM | POA: Insufficient documentation

## 2017-12-14 DIAGNOSIS — F329 Major depressive disorder, single episode, unspecified: Secondary | ICD-10-CM | POA: Insufficient documentation

## 2017-12-14 DIAGNOSIS — F17210 Nicotine dependence, cigarettes, uncomplicated: Secondary | ICD-10-CM | POA: Insufficient documentation

## 2017-12-14 DIAGNOSIS — F1010 Alcohol abuse, uncomplicated: Secondary | ICD-10-CM | POA: Insufficient documentation

## 2017-12-14 LAB — CBC
HEMATOCRIT: 41.6 % (ref 39.0–52.0)
Hemoglobin: 14.6 g/dL (ref 13.0–17.0)
MCH: 28.9 pg (ref 26.0–34.0)
MCHC: 35.1 g/dL (ref 30.0–36.0)
MCV: 82.4 fL (ref 78.0–100.0)
PLATELETS: 288 10*3/uL (ref 150–400)
RBC: 5.05 MIL/uL (ref 4.22–5.81)
RDW: 13.4 % (ref 11.5–15.5)
WBC: 10.9 10*3/uL — AB (ref 4.0–10.5)

## 2017-12-14 LAB — COMPREHENSIVE METABOLIC PANEL
ALT: 30 U/L (ref 0–44)
AST: 25 U/L (ref 15–41)
Albumin: 4.1 g/dL (ref 3.5–5.0)
Alkaline Phosphatase: 66 U/L (ref 38–126)
Anion gap: 16 — ABNORMAL HIGH (ref 5–15)
BUN: 9 mg/dL (ref 6–20)
CALCIUM: 9 mg/dL (ref 8.9–10.3)
CO2: 20 mmol/L — AB (ref 22–32)
CREATININE: 0.78 mg/dL (ref 0.61–1.24)
Chloride: 101 mmol/L (ref 98–111)
GFR calc non Af Amer: >60 mL/min (ref >60)
Glucose, Bld: 103 mg/dL — ABNORMAL HIGH (ref 70–99)
Potassium: 4.1 mmol/L (ref 3.5–5.1)
SODIUM: 137 mmol/L (ref 135–145)
Total Bilirubin: 0.7 mg/dL (ref 0.3–1.2)
Total Protein: 7.6 g/dL (ref 6.5–8.1)

## 2017-12-14 LAB — RAPID URINE DRUG SCREEN, HOSP PERFORMED
Amphetamines: POSITIVE — AB
BARBITURATES: NOT DETECTED
Benzodiazepines: NOT DETECTED
Cocaine: NOT DETECTED
Opiates: NOT DETECTED
Tetrahydrocannabinol: NOT DETECTED

## 2017-12-14 LAB — ETHANOL: Alcohol, Ethyl (B): 190 mg/dL — ABNORMAL HIGH (ref <10)

## 2017-12-14 MED ORDER — THIAMINE HCL 100 MG/ML IJ SOLN: 100.0000 mg | Freq: Every day | INTRAMUSCULAR | Status: DC

## 2017-12-14 MED ORDER — VITAMIN B-1 100 MG PO TABS: 100.0000 mg | ORAL_TABLET | Freq: Every day | ORAL | Status: DC

## 2017-12-14 MED ORDER — LORAZEPAM 1 MG PO TABS: 0.0000 mg | ORAL_TABLET | Freq: Two times a day (BID) | ORAL | Status: DC

## 2017-12-14 MED ORDER — LORAZEPAM 1 MG PO TABS: 0.0000 mg | ORAL_TABLET | Freq: Four times a day (QID) | ORAL | Status: DC

## 2017-12-14 MED ORDER — LORAZEPAM 2 MG/ML IJ SOLN: 0.0000 mg | Freq: Four times a day (QID) | INTRAMUSCULAR | Status: DC

## 2017-12-14 MED ORDER — LORAZEPAM 2 MG/ML IJ SOLN: 0.0000 mg | Freq: Two times a day (BID) | INTRAMUSCULAR | Status: DC

## 2017-12-14 NOTE — ED Triage Notes (Signed)
Pt requesting detox for ETOH use, last night @ midnight was the last reported drink, pt reports binge drinking 3-4 times a week, pt reports taking gfs Xanax for hangovers, pt calm and cooperative in triage, A&O x4

## 2017-12-14 NOTE — ED Provider Notes (Signed)
[facility] EMERGENCY DEPARTMENT Provider Note   CSN: [id number] Arrival date & time: 12/14/17  2952     History   Chief Complaint Chief Complaint  Patient presents with  . Alcohol Problem    HPI Bryan Nguyen is a 37 y.o. male with history of anxiety, depression, tobacco abuse, and seizures presents to the ED requesting assistance with alcohol detox.  He states that he drinks 3-4 times per week and usually drinks 12-18 beers when he does drink.  He states that occasionally his significant other will give him an Adderall when he "gets too drunk ".  She will also occasionally give him a Xanax or half a tablet of Xanax for his "hangover".  He states he has not had any Xanax in the past few days.  States last alcoholic beverage was around 2 AM last night.  He denies suicidal ideation, homicidal ideation, auditory or visual hallucinations.  He denies any significant anxiety presently, no shortness of breath, chest pain, abdominal pain, nausea, vomiting, or diarrhea.  He is attempted detox in the past with Librium taper.  He has not had any withdrawal seizures from detox.  The history is provided by the patient.    Past Medical History:  Diagnosis Date  . ANXIETY 03/25/2008  . DEPRESSION 12/29/2006  . PNEUMONIA, HX OF 12/29/2006  . SEIZURE DISORDER 12/29/2006  . TOBACCO ABUSE 04/10/2009    Patient Active Problem List   Diagnosis Date Noted  . Chest wall pain 12/13/2011  . ANXIETY 03/25/2008  . DEPRESSION 12/29/2006    Past Surgical History:  Procedure Laterality Date  . KNEE SURGERY          Home Medications    Prior to Admission medications   Medication Sig Start Date End Date Taking? Authorizing Provider  citalopram (CELEXA) 10 MG tablet Take 1 tablet (10 mg total) by mouth daily. 02/19/13   Roderick Pee, MD  clonazePAM Scarlette Calico) 0.5 MG tablet  01/24/13   [provider]  LORazepam (ATIVAN) 1 MG tablet 1 by mouth twice a day 04/17/13   Roderick Pee, MD  omeprazole (PRILOSEC) 20 MG capsule Take 1 capsule (20 mg total) by mouth daily. 09/13/17   Eber Hong, MD    Family History No family history on file.  Social History Social History   Tobacco Use  . Smoking status: Current Every Day Smoker    Packs/day: 0.50    Types: Cigarettes    Last attempt to quit: 02/08/2011    Years since quitting: 6.8  . Smokeless tobacco: Never Used  Substance Use Topics  . Alcohol use: Yes    Alcohol/week: 50.0 standard drinks    Types: 50 Cans of beer per week  . Drug use: No     Allergies   Patient has no known allergies.   Review of Systems Review of Systems  Constitutional: Negative for chills and fever.  Respiratory: Negative for shortness of breath.   Cardiovascular: Negative for chest pain.  Gastrointestinal: Negative for abdominal pain, nausea and vomiting.  Psychiatric/Behavioral: Negative for hallucinations and self-injury. The patient is nervous/anxious.   All other systems reviewed and are negative.    Physical Exam Updated Vital Signs BP (!) 143/88 (BP Location: Right Arm)   Pulse (!) 118   Temp 98 F (36.7 C) (Oral)   Resp 20   Ht 5\' 10"  (1.778 m)   Wt 102.1 kg   SpO2 99%   BMI 32.28 kg/m  Physical Exam  Constitutional: He is oriented to person, place, and time. He appears well-developed and well-nourished. No distress.  Resting comfortably in bed  HENT:  Head: Normocephalic and atraumatic.  Eyes: Conjunctivae are normal. Right eye exhibits no discharge. Left eye exhibits no discharge.  Neck: Normal range of motion. Neck supple. No JVD present. No tracheal deviation present.  Cardiovascular: Regular rhythm and normal heart sounds.  Tachycardic, 2+ radial and DP/PT pulses bilaterally,  no lower extremity edema, no palpable cords, compartments are soft   Pulmonary/Chest: Effort normal and breath sounds normal.  Abdominal: Soft. Bowel sounds are normal. He exhibits no distension. There is no  tenderness. There is no guarding.  Musculoskeletal: He exhibits no edema.  Neurological: He is alert and oriented to person, place, and time.  Skin: Skin is warm and dry. No erythema.  Psychiatric: He has a normal mood and affect. His behavior is normal.  Nursing note and vitals reviewed.    ED Treatments / Results  Labs (all labs ordered are listed, but only abnormal results are displayed) Labs Reviewed  COMPREHENSIVE METABOLIC PANEL - Abnormal; Notable for the following components:      Result Value   CO2 20 (*)    Glucose, Bld 103 (*)    Anion gap 16 (*)    All other components within normal limits  ETHANOL - Abnormal; Notable for the following components:   Alcohol, Ethyl (B) 190 (*)    All other components within normal limits  CBC - Abnormal; Notable for the following components:   WBC 10.9 (*)    All other components within normal limits  RAPID URINE DRUG SCREEN, HOSP PERFORMED - Abnormal; Notable for the following components:   Amphetamines POSITIVE (*)    All other components within normal limits    EKG None  Radiology No results found.  Procedures Procedures (including critical care time)  Medications Ordered in ED Medications - No data to display   Initial Impression / Assessment and Plan / ED Course  I have reviewed the triage vital signs and the nursing notes.  Pertinent labs & imaging results that were available during my care of the patient were reviewed by me and considered in my medical decision making (see chart for details).     Patient presents requesting detox from alcohol.  He is afebrile, persistently tachycardic in the ED.  He does appear comfortable and in no apparent distress.  He states he is not experiencing any significant withdrawal symptoms at this time.  UDS positive for amphetamines.  Lab work reviewed by me shows mild leukocytosis.  EtOH 190.  He does have a mildly elevated anion gap, likely secondary to ethanol.  CIWA protocol was  initiated, no prior history of alcohol withdrawal seizures.  He denies suicidal ideation or homicidal ideation.  I do not believe he is an imminent threat to himself or others at this time. Likely stable for discharge home with Librium taper and outpatient resources for detox.  10:29 AM Informed by RN that patient was seen walking out of the ED without informing ED staff that he was leaving. He did not receive any prescriptions or discharge paperwork from myself.  Final Clinical Impressions(s) / ED Diagnoses   Final diagnoses:  Alcohol abuse    ED Discharge Orders    None       Jeanie SewerFawze, Simora Dingee A, PA-C 12/15/17 0810    Cathren LaineSteinl, Kevin, MD 12/16/17 1317

## 2018-07-23 ENCOUNTER — Telehealth: Payer: Medicaid Other | Admitting: Physician Assistant

## 2018-07-23 ENCOUNTER — Encounter: Payer: Self-pay | Admitting: Physician Assistant

## 2018-07-23 DIAGNOSIS — R509 Fever, unspecified: Secondary | ICD-10-CM

## 2018-07-23 DIAGNOSIS — R079 Chest pain, unspecified: Secondary | ICD-10-CM

## 2018-07-23 NOTE — Progress Notes (Signed)
Based on what you shared with me, I feel your condition warrants further evaluation and I recommend that you be seen for a face to face office visit. I would say based on your questionnaire you are low risk for the coronavirus. My concern would be for an influenza-like virus but mentioning chest pain warrants assessment in person today to figure out what is going on.    NOTE: If you entered your credit card information for this eVisit, you will not be charged. You may see a "hold" on your card for the $35 but that hold will drop off and you will not have a charge processed.  If you are having a true medical emergency please call 911.  If you need an urgent face to face visit, [place] has four urgent care centers for your convenience.  If you need care fast and have a high deductible or no insurance consider:   [email or web address] to reserve your spot online an avoid wait times  [facility] [address], [address] [place], [place] [address] 8 am to 8 pm Monday-Friday 10 am to 4 pm Saturday-Sunday *Across the street from [employer]  [address] [place], [address] 8 am to 5 pm Monday-Friday * In the [facility] on the [facility]   The following sites will take your insurance:  . [facility]  [phone] Get Driving Directions Find a Provider at this Location  [address] [place], [place] [address] . 10 am to 8 pm Monday-Friday . 12 pm to 8 pm Saturday-Sunday   . [facility] at [facility]  [phone] Get Driving Directions Find a Provider at this Location  [address] [place], [place] [address] . 8 am to 8 pm Monday-Friday . 9 am to 6 pm Saturday . 11 am to 6 pm Sunday   . [facility] at [facility]  [phone] Get Driving Directions  [address] [place], [place] [address] . 8 am to 8 pm Monday-Friday . 8 am  to 4 pm Saturday-Sunday   Your e-visit answers were reviewed by a board certified advanced clinical practitioner to complete your personal care plan.  Thank you for using e-Visits.

## 2019-01-14 DIAGNOSIS — Z20828 Contact with and (suspected) exposure to other viral communicable diseases: Secondary | ICD-10-CM | POA: Diagnosis not present

## 2019-02-12 DIAGNOSIS — K219 Gastro-esophageal reflux disease without esophagitis: Secondary | ICD-10-CM | POA: Diagnosis not present

## 2019-03-15 DIAGNOSIS — R109 Unspecified abdominal pain: Secondary | ICD-10-CM | POA: Diagnosis not present

## 2019-03-15 DIAGNOSIS — K219 Gastro-esophageal reflux disease without esophagitis: Secondary | ICD-10-CM | POA: Diagnosis not present

## 2019-03-15 DIAGNOSIS — K921 Melena: Secondary | ICD-10-CM | POA: Diagnosis not present

## 2019-03-15 DIAGNOSIS — F17200 Nicotine dependence, unspecified, uncomplicated: Secondary | ICD-10-CM | POA: Diagnosis not present

## 2019-03-26 DIAGNOSIS — R109 Unspecified abdominal pain: Secondary | ICD-10-CM | POA: Diagnosis not present

## 2019-03-26 DIAGNOSIS — K921 Melena: Secondary | ICD-10-CM | POA: Diagnosis not present

## 2019-04-18 DIAGNOSIS — F411 Generalized anxiety disorder: Secondary | ICD-10-CM | POA: Diagnosis not present

## 2019-04-18 DIAGNOSIS — F331 Major depressive disorder, recurrent, moderate: Secondary | ICD-10-CM | POA: Diagnosis not present

## 2019-05-07 DIAGNOSIS — Z20828 Contact with and (suspected) exposure to other viral communicable diseases: Secondary | ICD-10-CM | POA: Diagnosis not present

## 2019-05-18 DIAGNOSIS — F3341 Major depressive disorder, recurrent, in partial remission: Secondary | ICD-10-CM | POA: Diagnosis not present

## 2019-05-18 DIAGNOSIS — F411 Generalized anxiety disorder: Secondary | ICD-10-CM | POA: Diagnosis not present

## 2019-07-23 DIAGNOSIS — K219 Gastro-esophageal reflux disease without esophagitis: Secondary | ICD-10-CM | POA: Diagnosis not present

## 2019-07-23 DIAGNOSIS — B360 Pityriasis versicolor: Secondary | ICD-10-CM | POA: Diagnosis not present

## 2019-09-12 IMAGING — US US ABDOMEN LIMITED
1 series · 14 of 25 positions shown · non-contrast
Comparison: None.

CLINICAL DATA: RIGHT upper quadrant pain for 3 days.

EXAM:
ULTRASOUND ABDOMEN LIMITED RIGHT UPPER QUADRANT

[Series 1: us abdomen limited · 0.21mm/px · 14 of 49 slices shown]
[im 1/49]
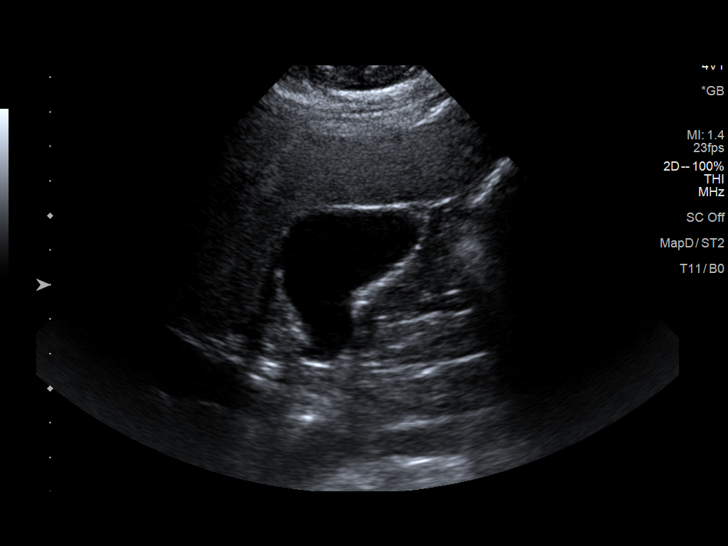
[im 5/49]
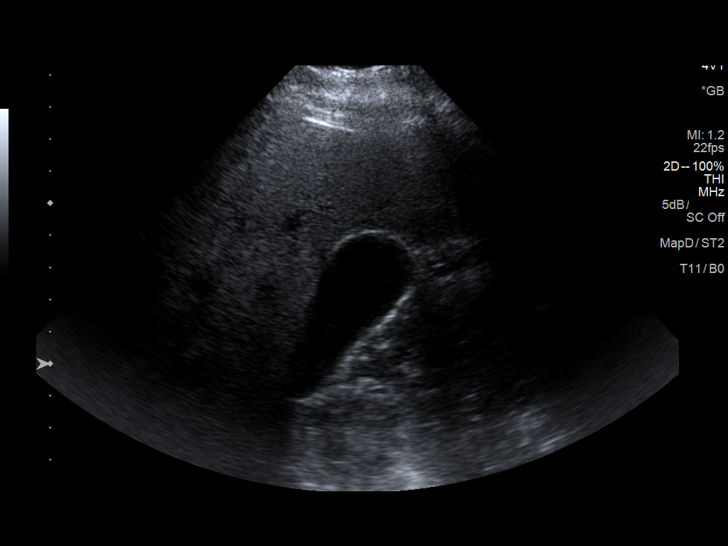
[im 9/49]
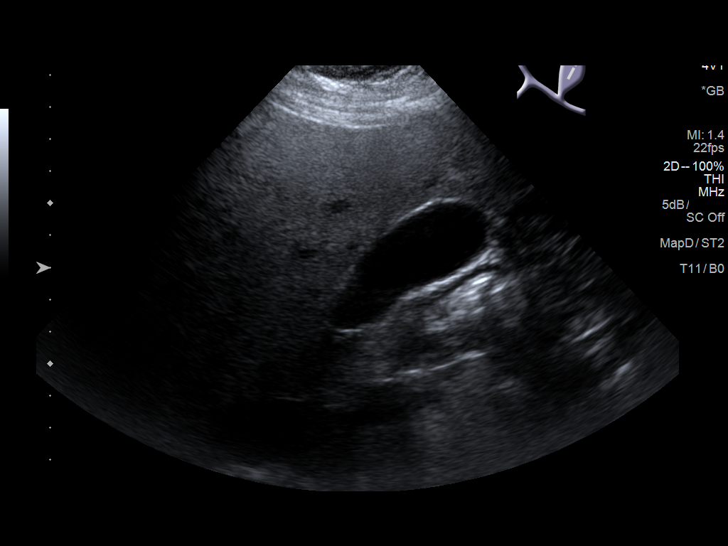
[im 13/49]
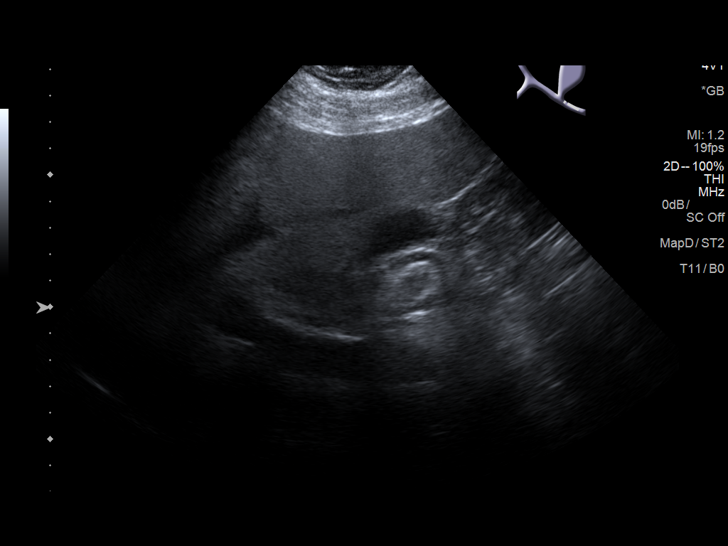
[im 17/49]
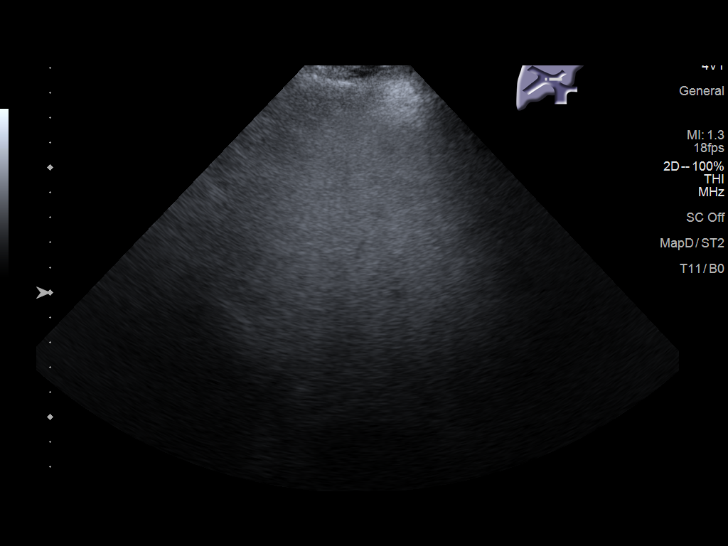
[im 19/49]
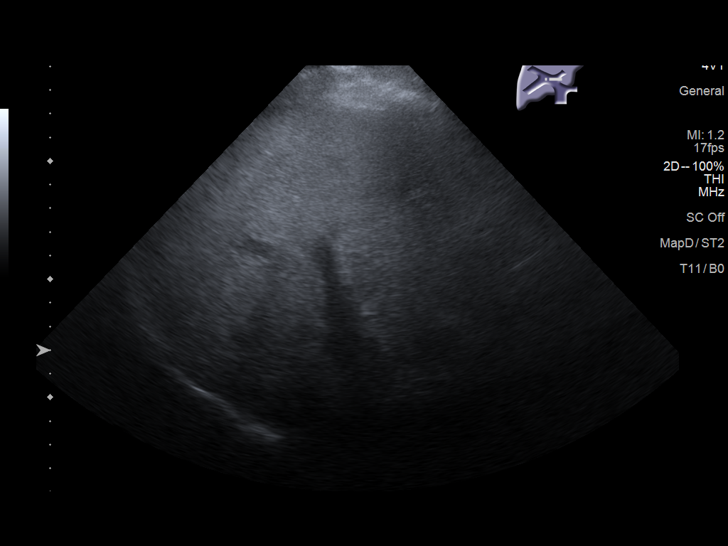
[im 23/49]
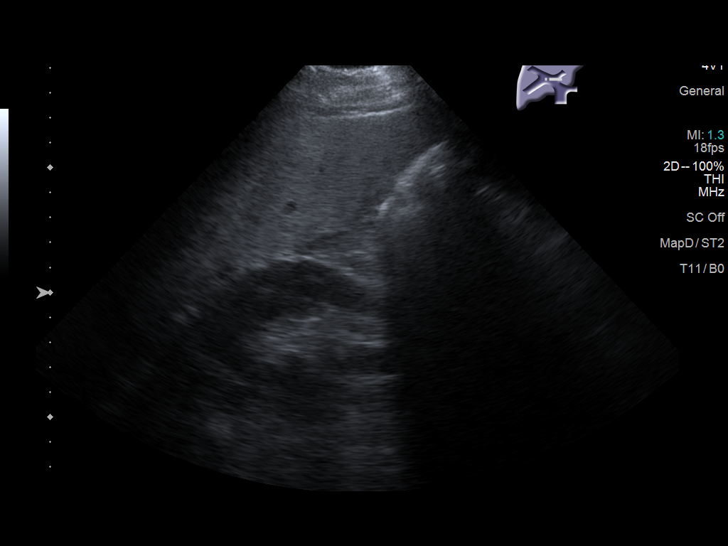
[im 27/49]
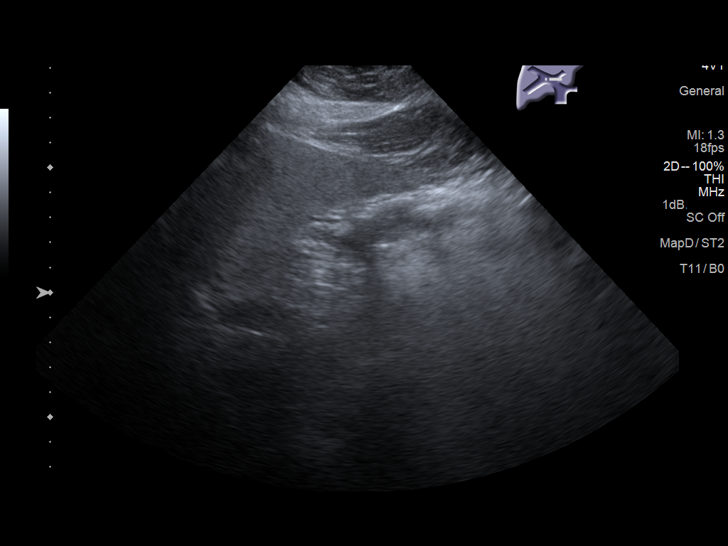
[im 31/49]
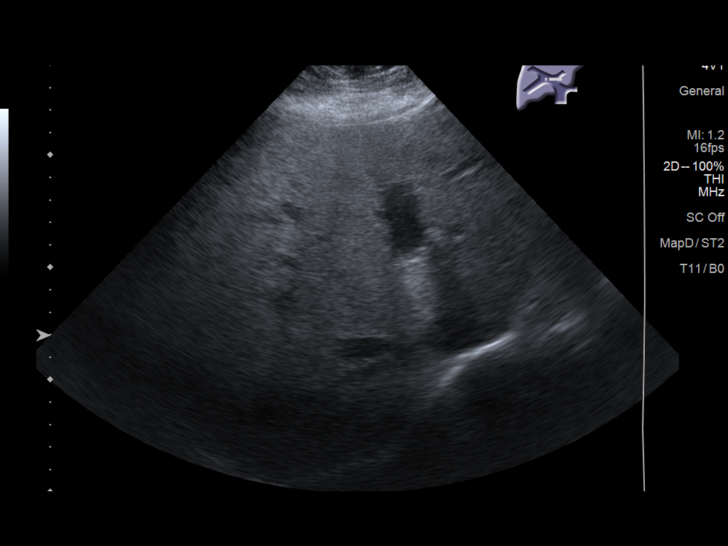
[im 33/49]
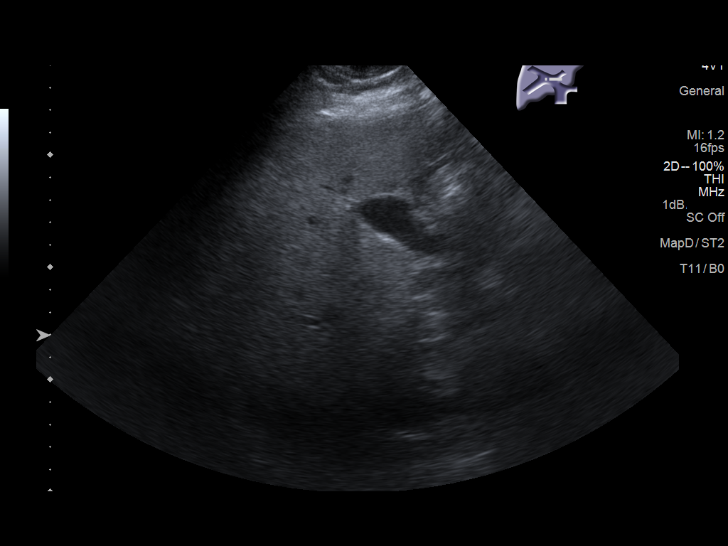
[im 37/49]
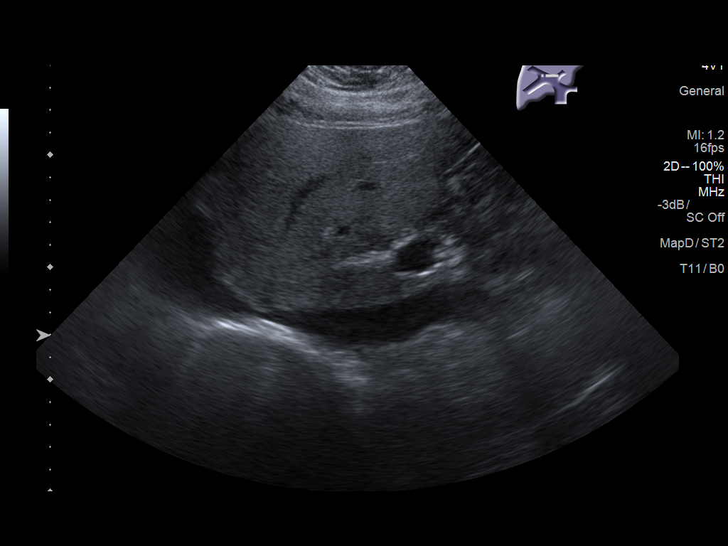
[im 41/49]
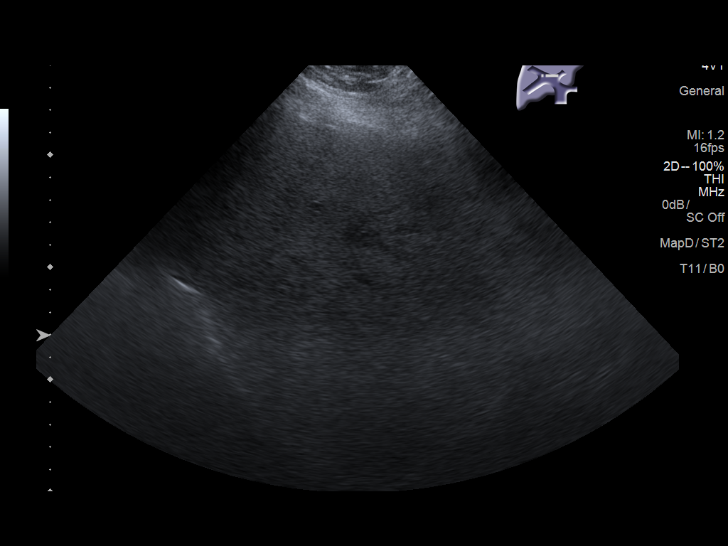
[im 45/49]
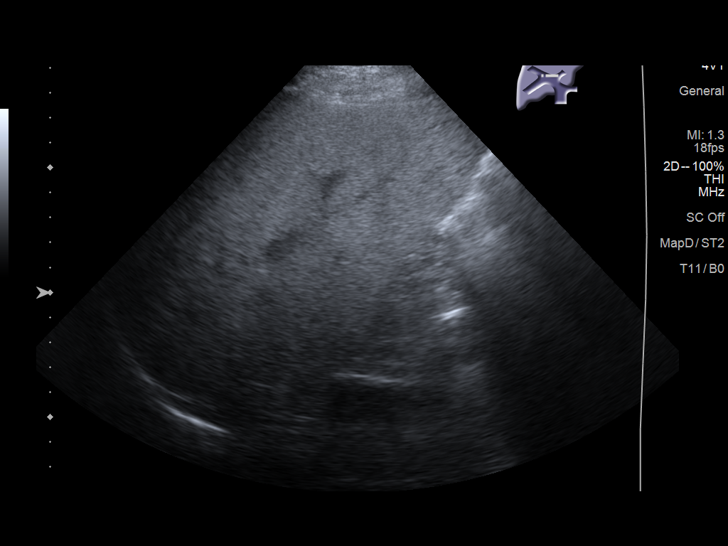
[im 49/49]
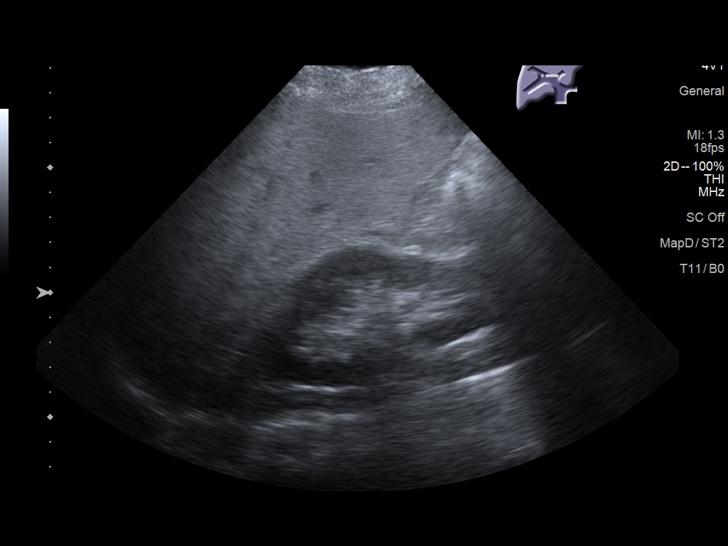

[14 of 25 positions shown; findings below may reference images not displayed]

FINDINGS: Gallbladder:

No gallstones or wall thickening visualized. No sonographic Murphy
sign noted by sonographer.

Common bile duct:

Diameter: Normal measuring 3.0 mm.

Liver:

No focal lesion identified. No dilated ducts. Increased echogenicity
portal vein is patent on color Doppler imaging with normal direction
of blood flow towards the liver.
IMPRESSION: No gallstones or biliary ductal dilatation.

Increased hepatic echogenicity, consistent with steatosis.
